# Patient Record
Sex: Male | Born: 1975 | Race: Black or African American | Hispanic: No | Marital: Married | Smoking: Never smoker
Health system: Southern US, Community
[De-identification: ages and names within clinical notes are randomized; demographics above are authoritative.]

---

## 1999-09-08 ENCOUNTER — Emergency Department (HOSPITAL_COMMUNITY): Admission: EM | Admit: 1999-09-08 | Discharge: 1999-09-08 | Payer: Self-pay | Admitting: Emergency Medicine

## 2001-08-01 ENCOUNTER — Emergency Department (HOSPITAL_COMMUNITY): Admission: EM | Admit: 2001-08-01 | Discharge: 2001-08-01 | Payer: Self-pay | Admitting: Emergency Medicine

## 2001-08-07 ENCOUNTER — Encounter: Payer: Self-pay | Admitting: Emergency Medicine

## 2001-08-07 ENCOUNTER — Emergency Department (HOSPITAL_COMMUNITY): Admission: EM | Admit: 2001-08-07 | Discharge: 2001-08-07 | Payer: Self-pay | Admitting: Emergency Medicine

## 2021-07-29 ENCOUNTER — Emergency Department (HOSPITAL_COMMUNITY): Payer: BC Managed Care – PPO

## 2021-07-29 ENCOUNTER — Other Ambulatory Visit: Payer: Self-pay

## 2021-07-29 ENCOUNTER — Encounter (HOSPITAL_COMMUNITY): Payer: Self-pay | Admitting: Emergency Medicine

## 2021-07-29 ENCOUNTER — Emergency Department (HOSPITAL_COMMUNITY)
Admission: EM | Admit: 2021-07-29 | Discharge: 2021-07-29 | Disposition: A | Discharge status: Home / Self Care | Payer: BC Managed Care – PPO | Attending: Emergency Medicine | Admitting: Emergency Medicine

## 2021-07-29 DIAGNOSIS — N201 Calculus of ureter: Secondary | ICD-10-CM | POA: Diagnosis not present

## 2021-07-29 DIAGNOSIS — R109 Unspecified abdominal pain: Secondary | ICD-10-CM | POA: Diagnosis present

## 2021-07-29 LAB — CBC WITH DIFFERENTIAL/PLATELET
Abs Immature Granulocytes: 0.02 10*3/uL (ref 0.00–0.07)
Basophils Absolute: 0 10*3/uL (ref 0.0–0.1)
Basophils Relative: 0 %
Eosinophils Absolute: 0 10*3/uL (ref 0.0–0.5)
Eosinophils Relative: 0 %
HCT: 46.1 % (ref 39.0–52.0)
Hemoglobin: 15.3 g/dL (ref 13.0–17.0)
Immature Granulocytes: 0 %
Lymphocytes Relative: 12 %
Lymphs Abs: 1.2 10*3/uL (ref 0.7–4.0)
MCH: 29.1 pg (ref 26.0–34.0)
MCHC: 33.2 g/dL (ref 30.0–36.0)
MCV: 87.8 fL (ref 80.0–100.0)
Monocytes Absolute: 0.9 10*3/uL (ref 0.1–1.0)
Monocytes Relative: 9 %
Neutro Abs: 8 10*3/uL — ABNORMAL HIGH (ref 1.7–7.7)
Neutrophils Relative %: 79 %
Platelets: 258 10*3/uL (ref 150–400)
RBC: 5.25 MIL/uL (ref 4.22–5.81)
RDW: 13.7 % (ref 11.5–15.5)
WBC: 10.2 10*3/uL (ref 4.0–10.5)
nRBC: 0 % (ref 0.0–0.2)

## 2021-07-29 LAB — URINALYSIS, ROUTINE W REFLEX MICROSCOPIC
Bilirubin Urine: NEGATIVE
Glucose, UA: NEGATIVE mg/dL
Hgb urine dipstick: NEGATIVE
Ketones, ur: NEGATIVE mg/dL
Leukocytes,Ua: NEGATIVE
Nitrite: NEGATIVE
Protein, ur: NEGATIVE mg/dL
Specific Gravity, Urine: 1.028 (ref 1.005–1.030)
pH: 5 (ref 5.0–8.0)

## 2021-07-29 LAB — BASIC METABOLIC PANEL
Anion gap: 10 (ref 5–15)
BUN: 13 mg/dL (ref 6–20)
CO2: 27 mmol/L (ref 22–32)
Calcium: 9.1 mg/dL (ref 8.9–10.3)
Chloride: 103 mmol/L (ref 98–111)
Creatinine, Ser: 1.56 mg/dL — ABNORMAL HIGH (ref 0.61–1.24)
GFR, Estimated: 56 mL/min — ABNORMAL LOW (ref >60)
Glucose, Bld: 138 mg/dL — ABNORMAL HIGH (ref 70–99)
Potassium: 4.1 mmol/L (ref 3.5–5.1)
Sodium: 140 mmol/L (ref 135–145)

## 2021-07-29 MED ORDER — OXYCODONE-ACETAMINOPHEN 5-325 MG PO TABS
2.0000 | ORAL_TABLET | Freq: Once | ORAL | Status: AC
  Administered 2021-07-29: 04:00:00 2 via ORAL
  Filled 2021-07-29: qty 2

## 2021-07-29 MED ORDER — TAMSULOSIN HCL 0.4 MG PO CAPS: 0.4000 mg | ORAL_CAPSULE | Freq: Every day | ORAL | 0 refills | Status: AC

## 2021-07-29 MED ORDER — OXYCODONE-ACETAMINOPHEN 5-325 MG PO TABS: 1.0000 | ORAL_TABLET | Freq: Four times a day (QID) | ORAL | 0 refills | Status: AC | PRN

## 2021-07-29 MED ORDER — IBUPROFEN 600 MG PO TABS: 600.0000 mg | ORAL_TABLET | Freq: Four times a day (QID) | ORAL | 0 refills | Status: AC | PRN

## 2021-07-29 MED ORDER — ONDANSETRON 4 MG PO TBDP
4.0000 mg | ORAL_TABLET | Freq: Once | ORAL | Status: AC
  Administered 2021-07-29: 04:00:00 4 mg via ORAL
  Filled 2021-07-29: qty 1

## 2021-07-29 MED ORDER — ONDANSETRON 4 MG PO TBDP: 4.0000 mg | ORAL_TABLET | Freq: Three times a day (TID) | ORAL | 0 refills | Status: AC | PRN

## 2021-07-29 NOTE — ED Triage Notes (Signed)
Patient reports right lower flank pain radiating to right lateral abdomen with emesis this evening , denies hematuria  or fever .

## 2021-07-29 NOTE — Discharge Instructions (Addendum)
You have a kidney stone on the right side of your bladder. It is close to making its way into your bladder--in fact it may have already passed into your bladder since the CT scan was done a while ago.  Please strain all urine  Please take Percocet for breakthrough pain but rely primarily on 600 mg of ibuprofen every 6 hours as prescribed.  Use Zofran for any nausea.  Plenty of water and add a little bit of lemon juice to each glass of water that you drink.  I have also prescribed you tamsulosin which will help the kidney stone pass as well.

## 2021-07-29 NOTE — ED Provider Notes (Addendum)
[facility] EMERGENCY DEPARTMENT Provider Note   CSN: [id number] Arrival date & time: 07/29/21  1601     History Chief Complaint  Patient presents with   Flank Pain    Ross Gonzalez is a 45 y.o. male.   Flank Pain Pertinent negatives include no chest pain, no abdominal pain, no headaches and no shortness of breath. Patient is a 45 year old male with past medical history detailed in HPI presented today to the ER with right flank pain that started this morning approximately 1 in the morning states that it was sharp and has been waxing and waning since.  No history of urinary stones.  Denies any medical problems apart from varicose veins which she is seeing a vascular doctor for.  He states that his pain is achy and mild at this time.  Denies any nausea currently but states he did have some earlier.  No diarrhea denies any blood in stool or dysuria frequency or urgency.  No cough congestion lightheadedness or dizziness.  No history of kidney stones.  No other associate symptoms.  Does not take any medications prior to arrival.    History reviewed. No pertinent past medical history.  There are no problems to display for this patient.   History reviewed. No pertinent surgical history.     No family history on file.  Social History   Tobacco Use   Smoking status: Never   Smokeless tobacco: Never  Substance Use Topics   Alcohol use: Never   Drug use: Never    Home Medications Prior to Admission medications   Medication Sig Start Date End Date Taking? Authorizing Provider  ibuprofen (ADVIL) 600 MG tablet Take 1 tablet (600 mg total) by mouth every 6 (six) hours as needed. 07/29/21  Yes Tanija Germani S, PA  ondansetron (ZOFRAN-ODT) 4 MG disintegrating tablet Take 1 tablet (4 mg total) by mouth every 8 (eight) hours as needed for nausea or vomiting. 07/29/21  Yes Ark Agrusa S, PA  oxyCODONE-acetaminophen (PERCOCET/ROXICET) 5-325 MG tablet Take 1  tablet by mouth every 6 (six) hours as needed for severe pain. 07/29/21  Yes Solon Augusta S, PA  tamsulosin (FLOMAX) 0.4 MG CAPS capsule Take 1 capsule (0.4 mg total) by mouth daily after supper. 07/29/21  Yes Gailen Shelter, PA    Allergies    Patient has no known allergies.  Review of Systems   Review of Systems  Constitutional:  Negative for chills and fever.  HENT:  Negative for congestion.   Eyes:  Negative for pain.  Respiratory:  Negative for cough and shortness of breath.   Cardiovascular:  Negative for chest pain and leg swelling.  Gastrointestinal:  Negative for abdominal pain and vomiting.  Genitourinary:  Positive for flank pain. Negative for dysuria.  Musculoskeletal:  Negative for myalgias.  Skin:  Negative for rash.  Neurological:  Negative for dizziness and headaches.   Physical Exam Updated Vital Signs BP 124/83 (BP Location: Right Arm)    Pulse (!) 55    Temp 98.5 F (36.9 C) (Oral)    Resp 14    Ht 6' 1.5" (1.867 m)    Wt 95 kg    SpO2 100%    BMI 27.26 kg/m   Physical Exam Vitals and nursing note reviewed.  Constitutional:      General: He is not in acute distress. HENT:     Head: Normocephalic and atraumatic.     Nose: Nose normal.  Eyes:  General: No scleral icterus. Cardiovascular:     Rate and Rhythm: Normal rate and regular rhythm.     Pulses: Normal pulses.     Heart sounds: Normal heart sounds.  Pulmonary:     Effort: Pulmonary effort is normal. No respiratory distress.     Breath sounds: No wheezing.  Abdominal:     Palpations: Abdomen is soft.     Tenderness: There is no abdominal tenderness. There is no right CVA tenderness, left CVA tenderness, guarding or rebound.  Musculoskeletal:     Cervical back: Normal range of motion.     Right lower leg: No edema.     Left lower leg: No edema.  Skin:    General: Skin is warm and dry.     Capillary Refill: Capillary refill takes less than 2 seconds.  Neurological:     Mental Status: He  is alert. Mental status is at baseline.  Psychiatric:        Mood and Affect: Mood normal.        Behavior: Behavior normal.    ED Results / Procedures / Treatments   Labs (all labs ordered are listed, but only abnormal results are displayed) Labs Reviewed  CBC WITH DIFFERENTIAL/PLATELET - Abnormal; Notable for the following components:      Result Value   Neutro Abs 8.0 (*)    All other components within normal limits  BASIC METABOLIC PANEL - Abnormal; Notable for the following components:   Glucose, Bld 138 (*)    Creatinine, Ser 1.56 (*)    GFR, Estimated 56 (*)    All other components within normal limits  URINALYSIS, ROUTINE W REFLEX MICROSCOPIC    EKG None  Radiology CT Renal Stone Study  Result Date: 07/29/2021 CLINICAL DATA:  45 year old male with history of flank pain. Suspected kidney stone. EXAM: CT ABDOMEN AND PELVIS WITHOUT CONTRAST TECHNIQUE: Multidetector CT imaging of the abdomen and pelvis was performed following the standard protocol without IV contrast. COMPARISON:  No priors. FINDINGS: Lower chest: Unremarkable. Hepatobiliary: No definite suspicious cystic or solid hepatic lesions are confidently identified on today's noncontrast CT examination. Gallbladder is nearly completely decompressed around several partially calcified indwelling gallstones. No surrounding inflammatory changes to suggest an acute cholecystitis at this time. Pancreas: No definite pancreatic mass or peripancreatic fluid collections or inflammatory changes are noted on today's noncontrast CT examination. Spleen: Unremarkable. Adrenals/Urinary Tract: At or immediately before the right ureterovesicular junction (axial image 72 of series 3) there is a 3 mm calculus. This is associated with very mild scratch at this is not associated with substantial proximal right hydroureteronephrosis at this time, however, there is some subtle haziness of the right renal sinus fat. No additional calculi are  identified within the collecting system of either kidney, elsewhere along the course of either ureter, or within the lumen of the urinary bladder. Unenhanced appearance of the kidneys, bilateral adrenal glands and urinary bladder is otherwise normal. Stomach/Bowel: Unenhanced appearance of the stomach is normal. There is no pathologic dilatation of small bowel or colon. Normal appendix. Vascular/Lymphatic: No atherosclerotic calcifications are noted in the abdominal aorta or pelvic vasculature. No lymphadenopathy noted in the abdomen or pelvis. Reproductive: Prostate gland and seminal vesicles are unremarkable in appearance. Other: Tiny epigastric ventral hernia containing only omental fat. No significant volume of ascites. No pneumoperitoneum. Musculoskeletal: There are no aggressive appearing lytic or blastic lesions noted in the visualized portions of the skeleton. IMPRESSION: 1. 3 mm calculus at or immediately before the right  ureterovesicular junction which is not associated with substantial proximal hydroureteronephrosis to indicate current obstruction, however, there is some haziness of the right renal sinus fat, which suggests inflammation and possibly recently relieved obstruction. 2. Tiny epigastric ventral hernia containing only omental fat. No associated bowel incarceration or obstruction at this time. Electronically Signed   By: Trudie Reed M.D.   On: 07/29/2021 05:10    Procedures Procedures   Medications Ordered in ED Medications  oxyCODONE-acetaminophen (PERCOCET/ROXICET) 5-325 MG per tablet 2 tablet (2 tablets Oral Given 07/29/21 0350)  ondansetron (ZOFRAN-ODT) disintegrating tablet 4 mg (4 mg Oral Given 07/29/21 0351)    ED Course  I have reviewed the triage vital signs and the nursing notes.  Pertinent labs & imaging results that were available during my care of the patient were reviewed by me and considered in my medical decision making (see chart for details).    MDM  Rules/Calculators/A&P                          Patient is a 45 year old male with past medical history detailed in HPI presented today to the ER with right flank pain that started this morning approximately 1 in the morning states that it was sharp and has been waxing and waning since.  He was given Zofran 1 tablet breaks in the waiting room states that his symptoms are dramatically improved.  CT renal stone study positive for 3 mm nonobstructing right-sided UVJ stone.   I personally reviewed all laboratory work and imaging.  Metabolic panel without any acute abnormality specifically kidney function within normal limits and no significant electrolyte abnormalities. CBC without leukocytosis or significant anemia.   Urine unremarkable.  Vital signs within normal limits.  Patient symptoms have significantly improved.  Tolerating p.o.  Ambulatory.  We will discharge patient with analgesia in the form of Percocet and ibuprofen we will also prescribe tamsulosin and Zofran  Follow-up with [facility].  Given the 3 mm size and location suspect will pass spontaneously.  Urine filter given.  Final Clinical Impression(s) / ED Diagnoses Final diagnoses:  Ureterolithiasis    Rx / DC Orders ED Discharge Orders          Ordered    ondansetron (ZOFRAN-ODT) 4 MG disintegrating tablet  Every 8 hours PRN        07/29/21 1031    ibuprofen (ADVIL) 600 MG tablet  Every 6 hours PRN        07/29/21 1031    oxyCODONE-acetaminophen (PERCOCET/ROXICET) 5-325 MG tablet  Every 6 hours PRN        07/29/21 1031    tamsulosin (FLOMAX) 0.4 MG CAPS capsule  Daily after supper        07/29/21 1059             Gailen Shelter, [place] 07/29/21 1100    Gailen Shelter, [place] 07/29/21 1101    Franne Forts, DO 07/29/21 2125

## 2021-07-29 NOTE — ED Provider Notes (Signed)
Emergency Medicine Provider Triage Evaluation Note  Ross Gonzalez , a 45 y.o. male  was evaluated in triage.  Pt complains of right flank pain since around 0115 this AM.  States pain sudden onset, sharp to right flank radiating to right abdomen.  Some nausea/vomiting.  Constant urge to urinate.  Denies hematuria.  No hx of kidney stones.  Review of Systems  Positive: Flank pain, vomiting Negative: fever  Physical Exam  BP (!) 145/89    Pulse (!) 50    Temp (!) 97.5 F (36.4 C) (Oral)    Resp 19    SpO2 100%  Gen:   Awake, no distress, appears uncomfortable Resp:  Normal effort  MSK:   Moves extremities without difficulty  Other:    Medical Decision Making  Medically screening exam initiated at 3:43 AM.  Appropriate orders placed.  Ross Gonzalez was informed that the remainder of the evaluation will be completed by another provider, this initial triage assessment does not replace that evaluation, and the importance of remaining in the ED until their evaluation is complete.  Right flank pain.  Suspicion for kidney stone.  Labs, UA, CT renal study.   Ross Hatchet, PA-C 07/29/21 0345    Ross Booze, MD 07/29/21 [phone]

## 2022-12-07 IMAGING — CT CT RENAL STONE PROTOCOL
2 of 4 series · 16 of 46 positions shown, 18 images · non-contrast
Comparison: No priors.

CLINICAL DATA: 45-year-old male with history of flank pain.
Suspected kidney stone.

EXAM:
CT ABDOMEN AND PELVIS WITHOUT CONTRAST
TECHNIQUE: Multidetector CT imaging of the abdomen and pelvis was performed
following the standard protocol without IV contrast.

[Series 3: renal stone 5.0 · axial · 0.98mm/px · z∈[+381,+821]mm · 13 of 96 slices shown, 15 images]
[im 4/96  soft-tissue]
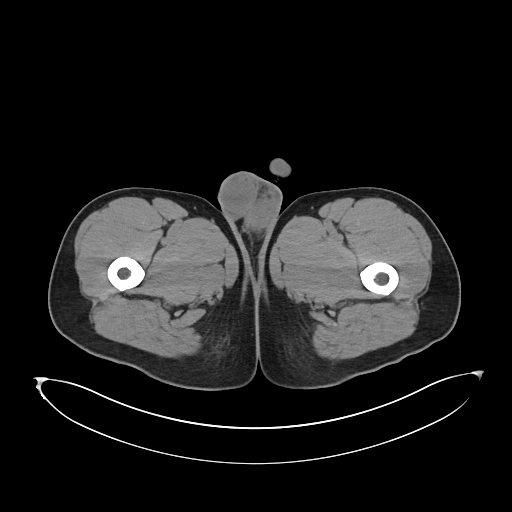
[im 4/96  bone]
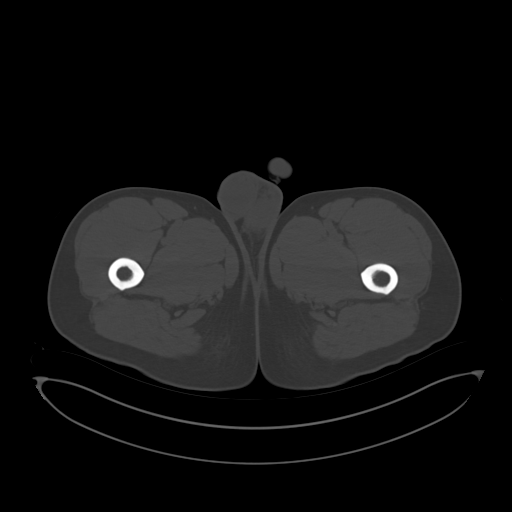
[im 12/96  soft-tissue]
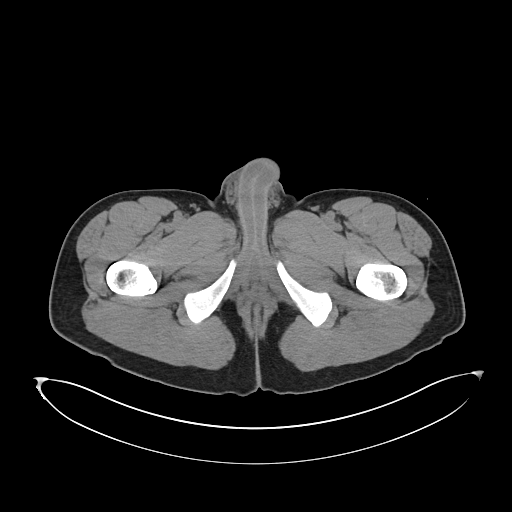
[im 20/96  soft-tissue]
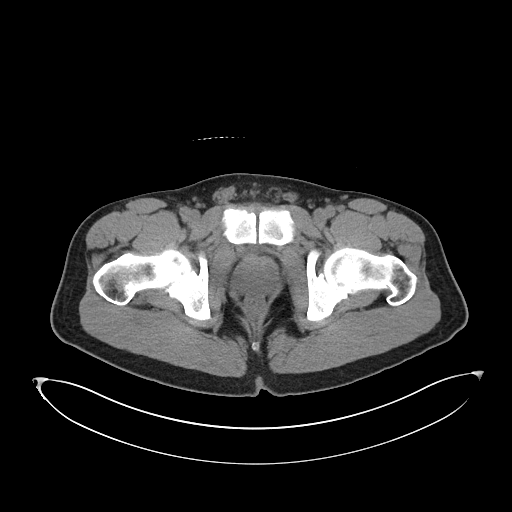
[im 28/96  soft-tissue]
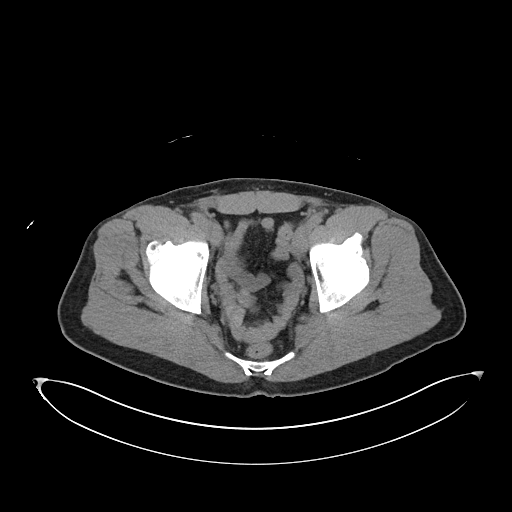
[im 32/96  soft-tissue]
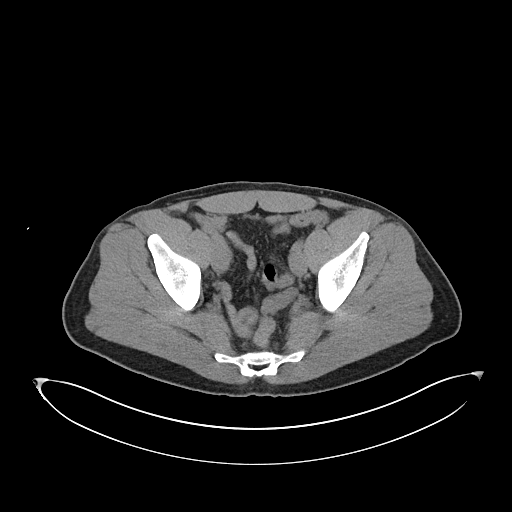
[im 40/96  soft-tissue]
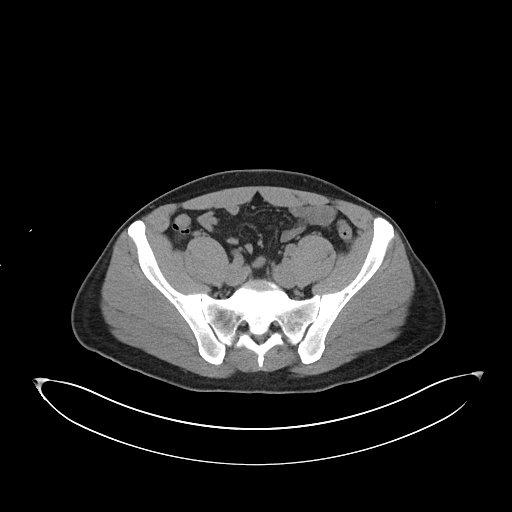
[im 48/96  soft-tissue]
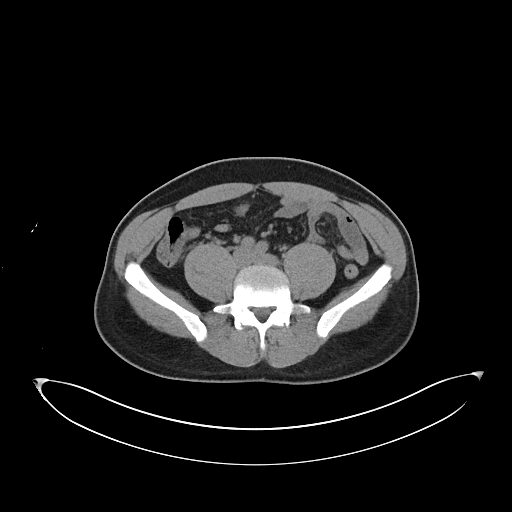
[im 56/96  soft-tissue]
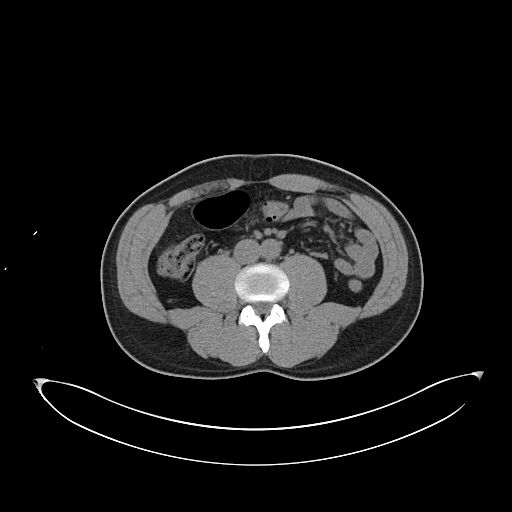
[im 64/96  soft-tissue]
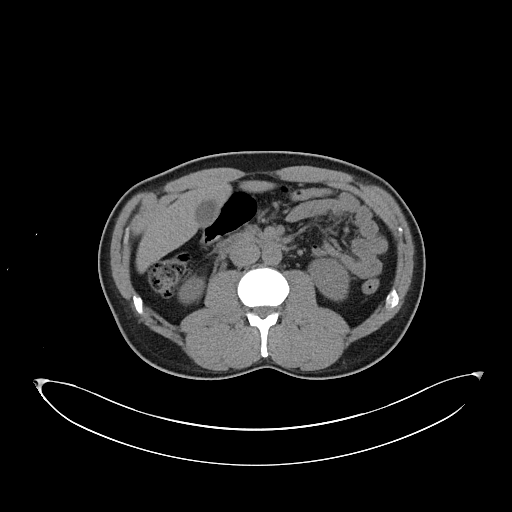
[im 64/96  bone]
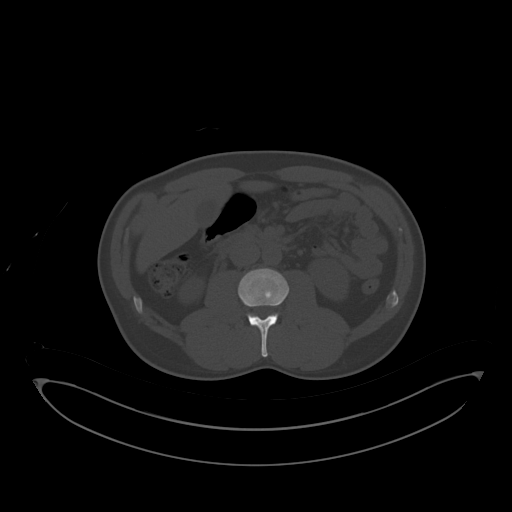
[im 68/96  soft-tissue]
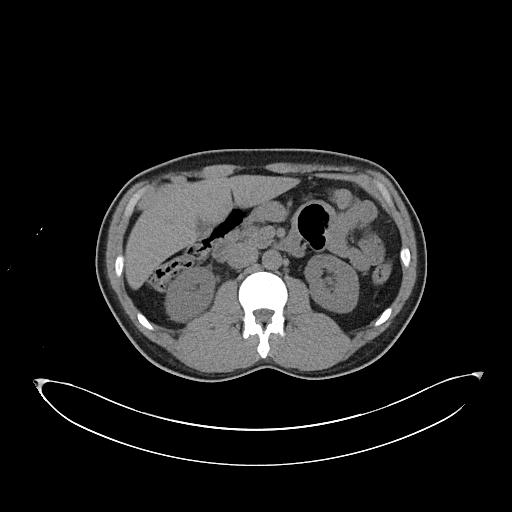
[im 76/96  soft-tissue]
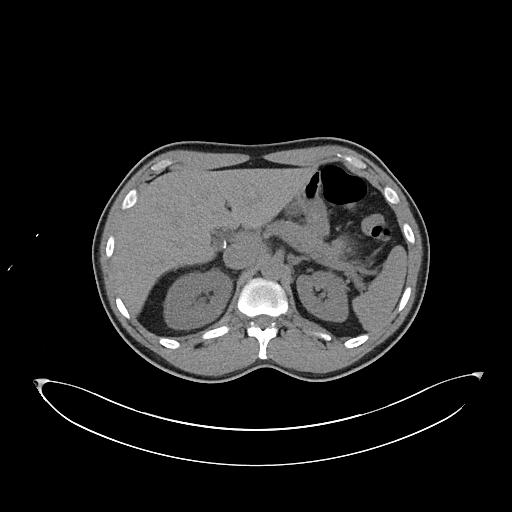
[im 84/96  soft-tissue]
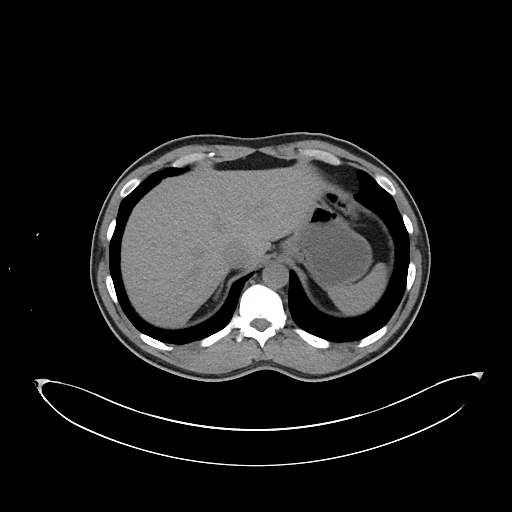
[im 92/96  soft-tissue]
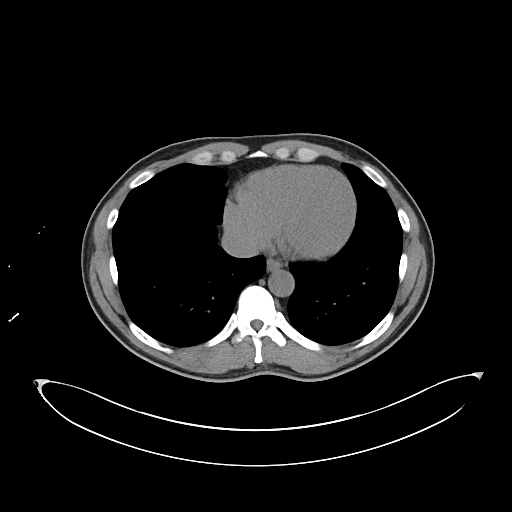

[Series 6: cor · coronal · 0.93mm/px · 3 of 145 slices shown]
[im 49/145  soft-tissue]
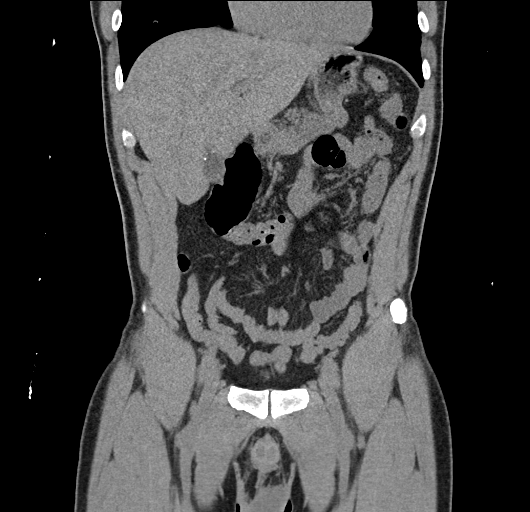
[im 65/145  soft-tissue]
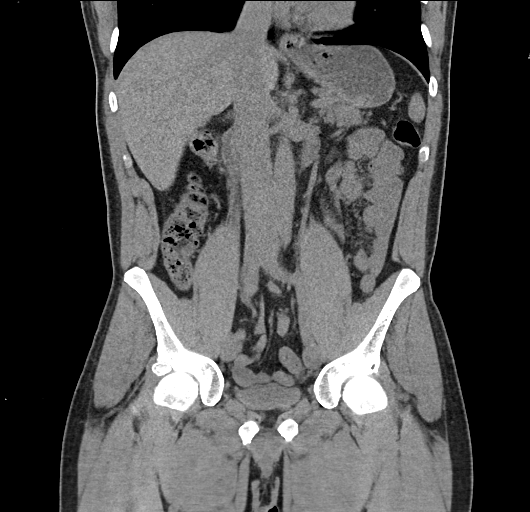
[im 81/145  soft-tissue]
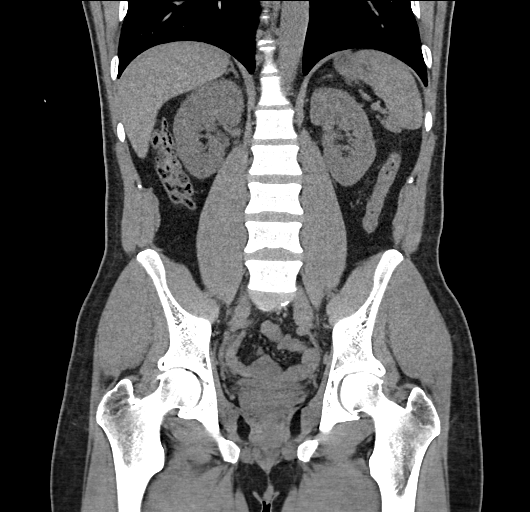

[16 of 46 positions shown; findings below may reference images not displayed]

FINDINGS: Lower chest: Unremarkable.

Hepatobiliary: No definite suspicious cystic or solid hepatic
lesions are confidently identified on today's noncontrast CT
examination. Gallbladder is nearly completely decompressed around
several partially calcified indwelling gallstones. No surrounding
inflammatory changes to suggest an acute cholecystitis at this time.

Pancreas: No definite pancreatic mass or peripancreatic fluid
collections or inflammatory changes are noted on today's noncontrast
CT examination.

Spleen: Unremarkable.

Adrenals/Urinary Tract: At or immediately before the right
ureterovesicular junction (axial image 72 of series 3) there is a 3
mm calculus. This is associated with very mild scratch at this is
not associated with substantial proximal right hydroureteronephrosis
at this time, however, there is some subtle haziness of the right
renal sinus fat. No additional calculi are identified within the
collecting system of either kidney, elsewhere along the course of
either ureter, or within the lumen of the urinary bladder.
Unenhanced appearance of the kidneys, bilateral adrenal glands and
urinary bladder is otherwise normal.

Stomach/Bowel: Unenhanced appearance of the stomach is normal. There
is no pathologic dilatation of small bowel or colon. Normal
appendix.

Vascular/Lymphatic: No atherosclerotic calcifications are noted in
the abdominal aorta or pelvic vasculature. No lymphadenopathy noted
in the abdomen or pelvis.

Reproductive: Prostate gland and seminal vesicles are unremarkable
in appearance.

Other: Tiny epigastric ventral hernia containing only omental fat.
No significant volume of ascites. No pneumoperitoneum.

Musculoskeletal: There are no aggressive appearing lytic or blastic
lesions noted in the visualized portions of the skeleton.
IMPRESSION: 1. 3 mm calculus at or immediately before the right ureterovesicular
junction which is not associated with substantial proximal
hydroureteronephrosis to indicate current obstruction, however,
there is some haziness of the right renal sinus fat, which suggests
inflammation and possibly recently relieved obstruction.
2. Tiny epigastric ventral hernia containing only omental fat. No
associated bowel incarceration or obstruction at this time.

## 2024-02-09 ENCOUNTER — Other Ambulatory Visit (HOSPITAL_BASED_OUTPATIENT_CLINIC_OR_DEPARTMENT_OTHER): Payer: Self-pay | Admitting: Family Medicine

## 2024-02-09 DIAGNOSIS — E786 Lipoprotein deficiency: Secondary | ICD-10-CM

## 2024-02-09 DIAGNOSIS — Z8249 Family history of ischemic heart disease and other diseases of the circulatory system: Secondary | ICD-10-CM

## 2024-02-27 ENCOUNTER — Ambulatory Visit (HOSPITAL_BASED_OUTPATIENT_CLINIC_OR_DEPARTMENT_OTHER)
Admission: RE | Admit: 2024-02-27 | Discharge: 2024-02-27 | Disposition: A | Discharge status: Home / Self Care | Payer: Self-pay | Source: Ambulatory Visit | Attending: Family Medicine | Admitting: Family Medicine

## 2024-02-27 DIAGNOSIS — E786 Lipoprotein deficiency: Secondary | ICD-10-CM | POA: Insufficient documentation

## 2024-02-27 DIAGNOSIS — Z8249 Family history of ischemic heart disease and other diseases of the circulatory system: Secondary | ICD-10-CM | POA: Insufficient documentation
